# Patient Record
Sex: Female | Born: 1955 | Race: White | Hispanic: No | Marital: Married | Smoking: Never smoker
Health system: Southern US, Community
[De-identification: ages and names within clinical notes are randomized; demographics above are authoritative.]

---

## 2006-07-25 ENCOUNTER — Ambulatory Visit: Payer: Self-pay | Admitting: Family Medicine

## 2007-11-29 ENCOUNTER — Ambulatory Visit: Payer: Self-pay | Admitting: Family Medicine

## 2007-12-13 ENCOUNTER — Ambulatory Visit: Payer: Self-pay | Admitting: Gastroenterology

## 2010-07-29 ENCOUNTER — Ambulatory Visit: Payer: Self-pay | Admitting: Family Medicine

## 2012-12-05 ENCOUNTER — Ambulatory Visit: Payer: Self-pay | Admitting: Obstetrics and Gynecology

## 2012-12-28 ENCOUNTER — Ambulatory Visit: Payer: Self-pay | Admitting: Internal Medicine

## 2013-01-28 ENCOUNTER — Ambulatory Visit: Payer: Self-pay | Admitting: Internal Medicine

## 2013-01-28 LAB — CBC CANCER CENTER
Basophil %: 0.5 %
Eosinophil #: 0.1 x10 3/mm (ref 0.0–0.7)
HCT: 39.5 % (ref 35.0–47.0)
MCHC: 35 g/dL (ref 32.0–36.0)
MCV: 89 fL (ref 80–100)
Monocyte #: 0.5 x10 3/mm (ref 0.2–0.9)
Monocyte %: 6.1 %
Neutrophil #: 5 x10 3/mm (ref 1.4–6.5)
RBC: 4.45 10*6/uL (ref 3.80–5.20)

## 2013-02-15 ENCOUNTER — Ambulatory Visit: Payer: Self-pay | Admitting: Internal Medicine

## 2014-10-09 ENCOUNTER — Ambulatory Visit: Payer: Self-pay | Admitting: Obstetrics and Gynecology

## 2015-07-12 ENCOUNTER — Emergency Department: Payer: BLUE CROSS/BLUE SHIELD

## 2015-07-12 ENCOUNTER — Encounter: Payer: Self-pay | Admitting: Emergency Medicine

## 2015-07-12 ENCOUNTER — Emergency Department
Admission: EM | Admit: 2015-07-12 | Discharge: 2015-07-13 | Disposition: A | Discharge status: Home / Self Care | Payer: BLUE CROSS/BLUE SHIELD | Attending: Emergency Medicine | Admitting: Emergency Medicine

## 2015-07-12 DIAGNOSIS — Y9389 Activity, other specified: Secondary | ICD-10-CM | POA: Insufficient documentation

## 2015-07-12 DIAGNOSIS — S52611A Displaced fracture of right ulna styloid process, initial encounter for closed fracture: Secondary | ICD-10-CM | POA: Insufficient documentation

## 2015-07-12 DIAGNOSIS — S52571A Other intraarticular fracture of lower end of right radius, initial encounter for closed fracture: Secondary | ICD-10-CM | POA: Insufficient documentation

## 2015-07-12 DIAGNOSIS — Y9289 Other specified places as the place of occurrence of the external cause: Secondary | ICD-10-CM | POA: Insufficient documentation

## 2015-07-12 DIAGNOSIS — Y998 Other external cause status: Secondary | ICD-10-CM | POA: Diagnosis not present

## 2015-07-12 DIAGNOSIS — S52501A Unspecified fracture of the lower end of right radius, initial encounter for closed fracture: Secondary | ICD-10-CM

## 2015-07-12 DIAGNOSIS — W1789XA Other fall from one level to another, initial encounter: Secondary | ICD-10-CM | POA: Diagnosis not present

## 2015-07-12 DIAGNOSIS — S6991XA Unspecified injury of right wrist, hand and finger(s), initial encounter: Secondary | ICD-10-CM | POA: Diagnosis present

## 2015-07-12 NOTE — ED Notes (Signed)
Pt presents to ED with c/o right wrist pain. Pt states she was trying to get off her grandsons Advertising copywriterhoover board and she lost her footing and fell back onto her bottom and right wrist. Pt has swelling with possible deformity. Ice applied in triage.

## 2015-07-13 MED ORDER — ONDANSETRON 4 MG PO TBDP
4.0000 mg | ORAL_TABLET | Freq: Once | ORAL | Status: AC
  Administered 2015-07-13: 4 mg via ORAL
  Filled 2015-07-13: qty 1

## 2015-07-13 MED ORDER — OXYCODONE-ACETAMINOPHEN 5-325 MG PO TABS: 1.0000 | ORAL_TABLET | Freq: Four times a day (QID) | ORAL | Status: DC | PRN

## 2015-07-13 MED ORDER — MORPHINE SULFATE (PF) 4 MG/ML IV SOLN
4.0000 mg | Freq: Once | INTRAVENOUS | Status: AC
  Administered 2015-07-13: 4 mg via INTRAMUSCULAR
  Filled 2015-07-13: qty 1

## 2015-07-13 MED ORDER — OXYCODONE-ACETAMINOPHEN 5-325 MG PO TABS
2.0000 | ORAL_TABLET | Freq: Once | ORAL | Status: AC
  Administered 2015-07-13: 2 via ORAL
  Filled 2015-07-13: qty 2

## 2015-07-13 NOTE — ED Provider Notes (Signed)
[facility] Emergency Department Provider Note  Time seen: 12:03 AM  I have reviewed the triage vital signs and the nursing notes.   HISTORY  Chief Complaint Wrist Pain    HPI Dorothy Ware is a 59 y.o. female with no past medical history who presents the emergency department with right wrist pain. According to the patient she was on a hover board and fell off catching herself on the ground. States immediate right wrist pain and swelling. Denies hitting her head or any other injury. Describes her pain as moderate, severe she attempts to move the wrist.     History reviewed. No pertinent past medical history.  There are no active problems to display for this patient.   Past Surgical History  Procedure Laterality Date  . Cesarean section      No current outpatient prescriptions on file.  Allergies Review of patient's allergies indicates no known allergies.  No family history on file.  Social History Social History  Substance Use Topics  . Smoking status: Never Smoker   . Smokeless tobacco: None  . Alcohol Use: Yes    Review of Systems Constitutional: Negative for fever. Cardiovascular: Negative for chest pain. Respiratory: Negative for shortness of breath. Gastrointestinal: Negative for abdominal pain Musculoskeletal: Right wrist pain and swelling Neurological: Negative for headache 10-point ROS otherwise negative.  ____________________________________________   PHYSICAL EXAM:  VITAL SIGNS: ED Triage Vitals  Enc Vitals Group     BP 07/12/15 2038 144/81 mmHg     Pulse Rate 07/12/15 2038 80     Resp 07/12/15 2038 18     Temp 07/12/15 2038 98.4 F (36.9 C)     Temp Source 07/12/15 2038 Oral     SpO2 07/12/15 2038 100 %     Weight 07/12/15 2038 180 lb (81.647 kg)     Height 07/12/15 2038  (1.575 m)     Head Cir --      Peak Flow --      Pain Score 07/12/15 2038 4     Pain Loc --      Pain Edu? --      Excl. in GC? --      Constitutional: Alert and oriented. Well appearing and in no distress. Eyes: Normal exam ENT   Head: Normocephalic and atraumatic.   Mouth/Throat: Mucous membranes are moist. Cardiovascular: Normal rate, regular rhythm. No murmur Respiratory: Normal respiratory effort without tachypnea nor retractions. Breath sounds are clear  Gastrointestinal: Soft and nontender. No distention.  Musculoskeletal: Moderate right wrist swelling, with moderate tenderness to palpation. Good cap refill in all fingers, sensation intact distally. No laceration or break in skin. Neurologic:  Normal speech and language. No gross focal neurologic deficits Skin:  Skin is warm, dry and intact.  Psychiatric: Mood and affect are normal. Speech and behavior are normal.  ____________________________________________   RADIOLOGY  X-ray shows intra-articular fracture of the distal radius with mild ulnar styloid fracture  ____________________________________________   INITIAL IMPRESSION / ASSESSMENT AND PLAN / ED COURSE  Pertinent labs & imaging results that were available during my care of the patient were reviewed by me and considered in my medical decision making (see chart for details).  Patient presents with right wrist pain after falling off of a toy. X-ray shows comminuted minimally displaced fracture of the distal radius with extension into the articular surface, also with mild ulnar styloid fracture. We'll discuss with orthopedics, likely treat pain and splint in place.  Unable to reach  the orthopedist after 2 attempts. We will splint in place, discharge home with orthopedic follow-up tomorrow. Patient agreeable to plan. Patient remains neurovascular intact status post splint.  ____________________________________________   FINAL CLINICAL IMPRESSION(S) / ED DIAGNOSES  Right distal radius fracture Right distal ulna fracture   Minna AntisKevin Heloise Gordan, MD 07/13/15 [phone]

## 2015-07-13 NOTE — Discharge Instructions (Signed)
Wrist Fracture °A wrist fracture is a break or crack in one of the bones of your wrist. Your wrist is made up of eight small bones at the palm of your hand (carpal bones) and two long bones that make up your forearm (radius and ulna). °CAUSES °· A direct blow to the wrist. °· Falling on an outstretched hand. °· Trauma, such as a car accident or a fall. °RISK FACTORS °Risk factors for wrist fracture include: °· Participating in contact and high-risk sports, such as skiing, biking, and ice skating. °· Taking steroid medicines. °· Smoking. °· Being female. °· Being Caucasian. °· Drinking more than three alcoholic beverages per day. °· Having low or lowered bone density (osteoporosis or osteopenia). °· Age. Older adults have decreased bone density. °· Women who have had menopause. °· History of previous fractures. °SIGNS AND SYMPTOMS °Symptoms of wrist fractures include tenderness, bruising, and inflammation. Additionally, the wrist may hang in an odd position or appear deformed. °DIAGNOSIS °Diagnosis may include: °· Physical exam. °· X-ray. °TREATMENT °Treatment depends on many factors, including the nature and location of the fracture, your age, and your activity level. Treatment for wrist fracture can be nonsurgical or surgical. °Nonsurgical Treatment °A plaster cast or splint may be applied to your wrist if the bone is in a good position. If the fracture is not in good position, it may be necessary for your health care provider to realign it before applying a splint or cast. Usually, a cast or splint will be worn for several weeks. °Surgical Treatment °Sometimes the position of the bone is so far out of place that surgery is required to apply a device to hold it together as it heals. Depending on the fracture, there are a number of options for holding the bone in place while it heals, such as a cast and metal pins. °HOME CARE INSTRUCTIONS °· Keep your injured wrist elevated and move your fingers as much as  possible. °· Do not put pressure on any part of your cast or splint. It may break. °· Use a plastic bag to protect your cast or splint from water while bathing or showering. Do not lower your cast or splint into water. °· Take medicines only as directed by your health care provider. °· Keep your cast or splint clean and dry. If it becomes wet, damaged, or suddenly feels too tight, contact your health care provider right away. °· Do not use any tobacco products including cigarettes, chewing tobacco, or electronic cigarettes. Tobacco can delay bone healing. If you need help quitting, ask your health care provider. °· Keep all follow-up visits as directed by your health care provider. This is important. °· Ask your health care provider if you should take supplements of calcium and vitamins C and D to promote bone healing. °SEEK MEDICAL CARE IF: °· Your cast or splint is damaged, breaks, or gets wet. °· You have a fever. °· You have chills. °· You have continued severe pain or more swelling than you did before the cast was put on. °SEEK IMMEDIATE MEDICAL CARE IF: °· Your hand or fingernails on the injured arm turn blue or gray, or feel cold or numb. °· You have decreased feeling in the fingers of your injured arm. °MAKE SURE YOU: °· Understand these instructions. °· Will watch your condition. °· Will get help right away if you are not doing well or get worse. °  °This information is not intended to replace advice given to you by your   health care provider. Make sure you discuss any questions you have with your health care provider. °  °Document Released: 04/13/2005 Document Revised: 03/25/2015 Document Reviewed: 07/22/2011 °Elsevier Interactive Patient Education ©2016 [employer] Inc. ° °

## 2015-07-13 NOTE — ED Notes (Signed)
Pt dc home ambulatory pain unchanged instructed on follow up plan PT NAD AT DC 

## 2015-10-07 ENCOUNTER — Other Ambulatory Visit: Payer: Self-pay | Admitting: Obstetrics and Gynecology

## 2015-10-07 DIAGNOSIS — Z1231 Encounter for screening mammogram for malignant neoplasm of breast: Secondary | ICD-10-CM

## 2015-10-12 ENCOUNTER — Ambulatory Visit: Payer: BLUE CROSS/BLUE SHIELD | Attending: Obstetrics and Gynecology

## 2015-10-23 ENCOUNTER — Ambulatory Visit
Admission: RE | Admit: 2015-10-23 | Discharge: 2015-10-23 | Disposition: A | Discharge status: Home / Self Care | Payer: BLUE CROSS/BLUE SHIELD | Source: Ambulatory Visit | Attending: Obstetrics and Gynecology | Admitting: Obstetrics and Gynecology

## 2015-10-23 DIAGNOSIS — Z1231 Encounter for screening mammogram for malignant neoplasm of breast: Secondary | ICD-10-CM

## 2015-11-20 ENCOUNTER — Telehealth: Payer: Self-pay | Admitting: *Deleted

## 2015-11-20 NOTE — Telephone Encounter (Signed)
Received fax from Dr. Francesca OmanSchermerhorn's office with message to Dr. Neale BurlyGitten regarding his seeing patient in 2014 for decreased WBC.  She was asking if there are any new recommendations.  Per Dr. Donneta RombergBrahmanday, if the patient is not having frequent infections there are no new recommendations at this time.  [facility] spoke with Shanda BumpsJessica who will relay the message to MD on Monday, May 8.

## 2016-12-08 ENCOUNTER — Other Ambulatory Visit: Payer: Self-pay | Admitting: Obstetrics and Gynecology

## 2016-12-08 DIAGNOSIS — Z1231 Encounter for screening mammogram for malignant neoplasm of breast: Secondary | ICD-10-CM

## 2016-12-27 ENCOUNTER — Ambulatory Visit
Admission: RE | Admit: 2016-12-27 | Discharge: 2016-12-27 | Disposition: A | Discharge status: Home / Self Care | Payer: BLUE CROSS/BLUE SHIELD | Source: Ambulatory Visit | Attending: Obstetrics and Gynecology | Admitting: Obstetrics and Gynecology

## 2016-12-27 DIAGNOSIS — Z1231 Encounter for screening mammogram for malignant neoplasm of breast: Secondary | ICD-10-CM

## 2018-09-17 ENCOUNTER — Other Ambulatory Visit: Payer: Self-pay | Admitting: Obstetrics and Gynecology

## 2018-09-17 DIAGNOSIS — Z1231 Encounter for screening mammogram for malignant neoplasm of breast: Secondary | ICD-10-CM

## 2018-09-28 ENCOUNTER — Other Ambulatory Visit: Payer: Self-pay

## 2018-09-28 ENCOUNTER — Ambulatory Visit
Admission: RE | Admit: 2018-09-28 | Discharge: 2018-09-28 | Disposition: A | Discharge status: Home / Self Care | Payer: BLUE CROSS/BLUE SHIELD | Source: Ambulatory Visit | Attending: Obstetrics and Gynecology | Admitting: Obstetrics and Gynecology

## 2018-09-28 DIAGNOSIS — Z1231 Encounter for screening mammogram for malignant neoplasm of breast: Secondary | ICD-10-CM | POA: Insufficient documentation

## 2018-10-01 ENCOUNTER — Other Ambulatory Visit: Payer: Self-pay | Admitting: Obstetrics and Gynecology

## 2018-10-01 DIAGNOSIS — N6310 Unspecified lump in the right breast, unspecified quadrant: Secondary | ICD-10-CM

## 2018-11-05 ENCOUNTER — Other Ambulatory Visit: Payer: Self-pay

## 2018-11-05 ENCOUNTER — Ambulatory Visit
Admission: RE | Admit: 2018-11-05 | Discharge: 2018-11-05 | Disposition: A | Discharge status: Home / Self Care | Payer: BLUE CROSS/BLUE SHIELD | Source: Ambulatory Visit | Attending: Obstetrics and Gynecology | Admitting: Obstetrics and Gynecology

## 2018-11-05 DIAGNOSIS — N6310 Unspecified lump in the right breast, unspecified quadrant: Secondary | ICD-10-CM

## 2019-09-27 ENCOUNTER — Other Ambulatory Visit: Payer: Self-pay

## 2019-09-27 ENCOUNTER — Ambulatory Visit: Payer: BC Managed Care – PPO | Attending: Internal Medicine

## 2019-09-27 DIAGNOSIS — Z23 Encounter for immunization: Secondary | ICD-10-CM

## 2019-09-27 NOTE — Progress Notes (Signed)
   Covid-19 Vaccination Clinic  Name:  Dorothy Ware    MRN: [id number] DOB: [birth year]  09/27/2019  [name] was observed post Covid-19 immunization for 15 minutes without incident. She was provided with Vaccine Information Sheet and instruction to access the V-Safe system.   [name] was instructed to call 911 with any severe reactions post vaccine: Marland Kitchen Difficulty breathing  . Swelling of face and throat  . A fast heartbeat  . A bad rash all over body  . Dizziness and weakness   Immunizations Administered    Name Date Dose VIS Date Route   Pfizer COVID-19 Vaccine 09/27/2019 10:23 AM 0.3 mL 06/28/2019 Intramuscular   Manufacturer: [employer], [employer]   Lot: [id number]   NDC: [address]-4

## 2019-10-16 ENCOUNTER — Other Ambulatory Visit: Payer: Self-pay | Admitting: Obstetrics and Gynecology

## 2019-10-16 DIAGNOSIS — Z1231 Encounter for screening mammogram for malignant neoplasm of breast: Secondary | ICD-10-CM

## 2019-10-22 ENCOUNTER — Other Ambulatory Visit: Payer: Self-pay

## 2019-10-22 ENCOUNTER — Ambulatory Visit: Payer: BC Managed Care – PPO | Attending: Internal Medicine

## 2019-10-22 DIAGNOSIS — Z23 Encounter for immunization: Secondary | ICD-10-CM

## 2019-10-22 NOTE — Progress Notes (Signed)
   Covid-19 Vaccination Clinic  Name:  Dorothy Ware    MRN: [id number] DOB: 31-Aug-1955  10/22/2019  [name] was observed post Covid-19 immunization for 15 minutes without incident. She was provided with Vaccine Information Sheet and instruction to access the V-Safe system.   [name] was instructed to call 911 with any severe reactions post vaccine: Marland Kitchen Difficulty breathing  . Swelling of face and throat  . A fast heartbeat  . A bad rash all over body  . Dizziness and weakness   Immunizations Administered    Name Date Dose VIS Date Route   Pfizer COVID-19 Vaccine 10/22/2019  4:04 PM 0.3 mL 06/28/2019 Intramuscular   Manufacturer: [employer], [employer]   Lot: [id number]   NDC: [address]-4

## 2019-12-09 ENCOUNTER — Ambulatory Visit
Admission: RE | Admit: 2019-12-09 | Discharge: 2019-12-09 | Disposition: A | Discharge status: Home / Self Care | Payer: BC Managed Care – PPO | Source: Ambulatory Visit | Attending: Obstetrics and Gynecology | Admitting: Obstetrics and Gynecology

## 2019-12-09 DIAGNOSIS — Z1231 Encounter for screening mammogram for malignant neoplasm of breast: Secondary | ICD-10-CM | POA: Insufficient documentation

## 2021-01-20 ENCOUNTER — Other Ambulatory Visit: Payer: Self-pay | Admitting: Family

## 2021-01-20 ENCOUNTER — Other Ambulatory Visit: Payer: Self-pay | Admitting: Obstetrics and Gynecology

## 2021-01-20 DIAGNOSIS — Z1231 Encounter for screening mammogram for malignant neoplasm of breast: Secondary | ICD-10-CM

## 2021-01-27 ENCOUNTER — Ambulatory Visit
Admission: RE | Admit: 2021-01-27 | Discharge: 2021-01-27 | Disposition: A | Discharge status: Home / Self Care | Payer: Medicare HMO | Source: Ambulatory Visit | Attending: Family | Admitting: Family

## 2021-01-27 ENCOUNTER — Other Ambulatory Visit: Payer: Self-pay

## 2021-01-27 DIAGNOSIS — Z1231 Encounter for screening mammogram for malignant neoplasm of breast: Secondary | ICD-10-CM | POA: Insufficient documentation

## 2021-08-18 IMAGING — MG MM DIGITAL SCREENING BILAT W/ TOMO AND CAD
6 of 10 series · 6 of 30 positions shown · non-contrast
Comparison: Previous exam(s).

CLINICAL DATA: Screening.

EXAM:
DIGITAL SCREENING BILATERAL MAMMOGRAM WITH TOMOSYNTHESIS AND CAD
TECHNIQUE: Bilateral screening digital craniocaudal and mediolateral oblique
mammograms were obtained. Bilateral screening digital breast
tomosynthesis was performed. The images were evaluated with
computer-aided detection.

[L CC synth-2D]
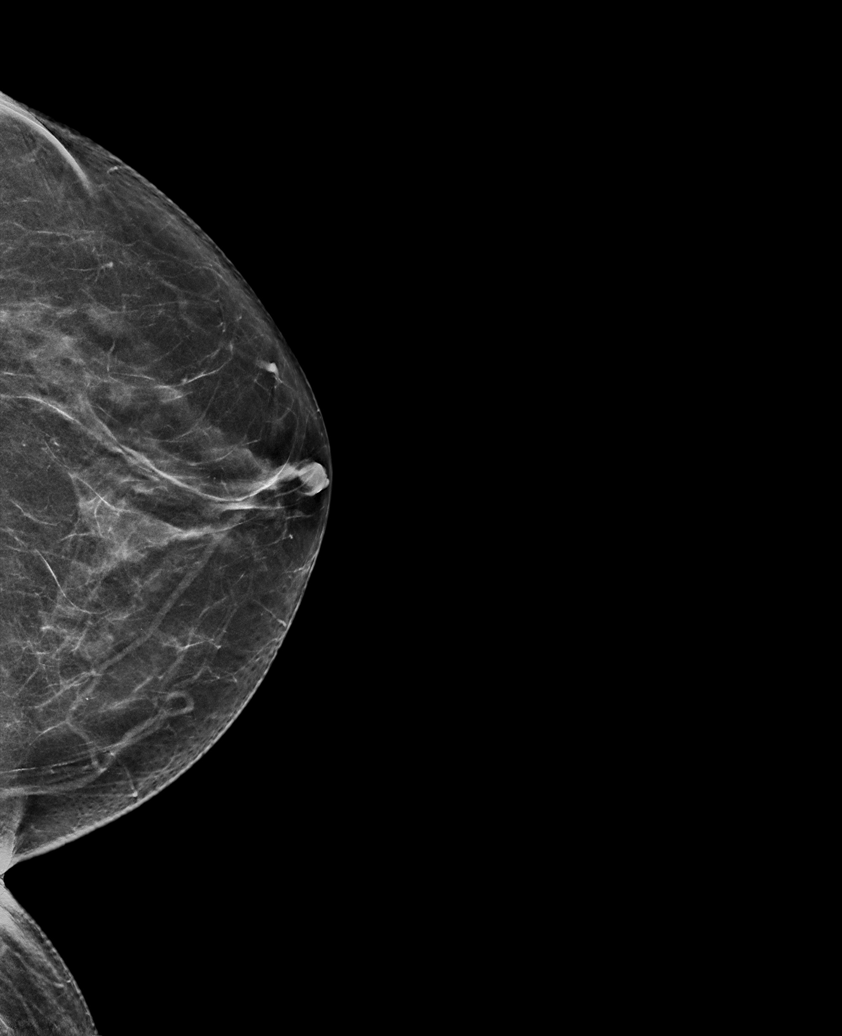

[L MLO synth-2D]
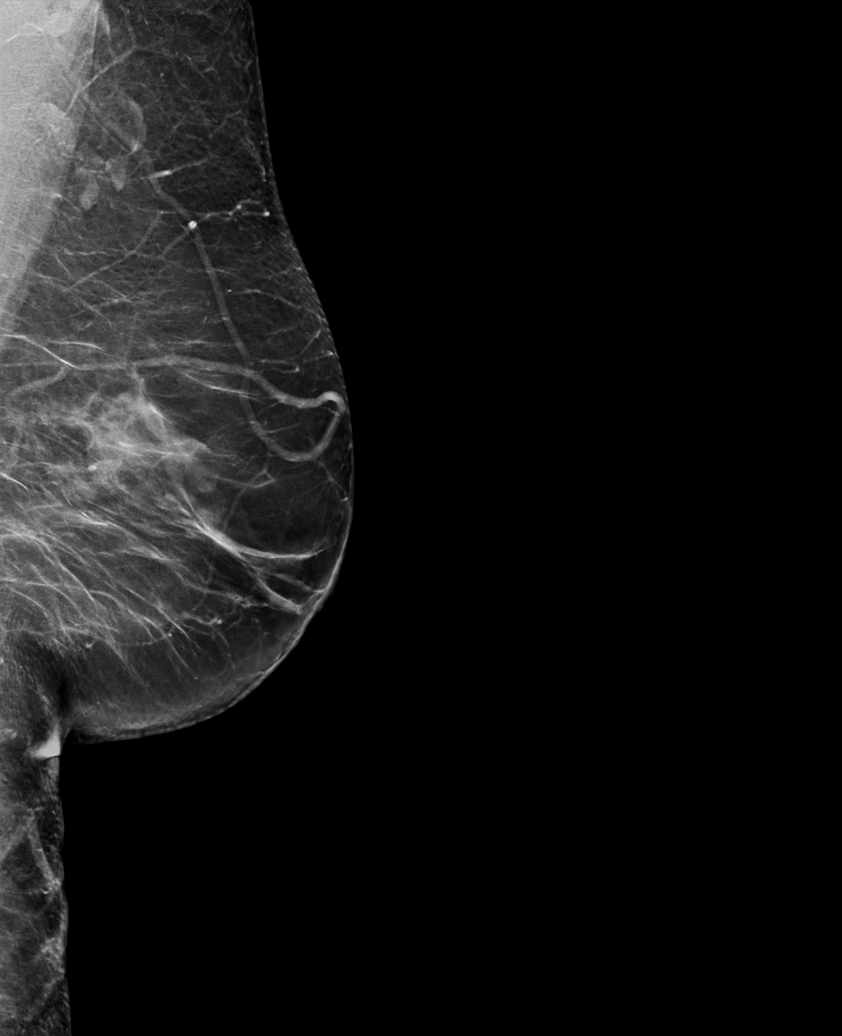

[R CC synth-2D]
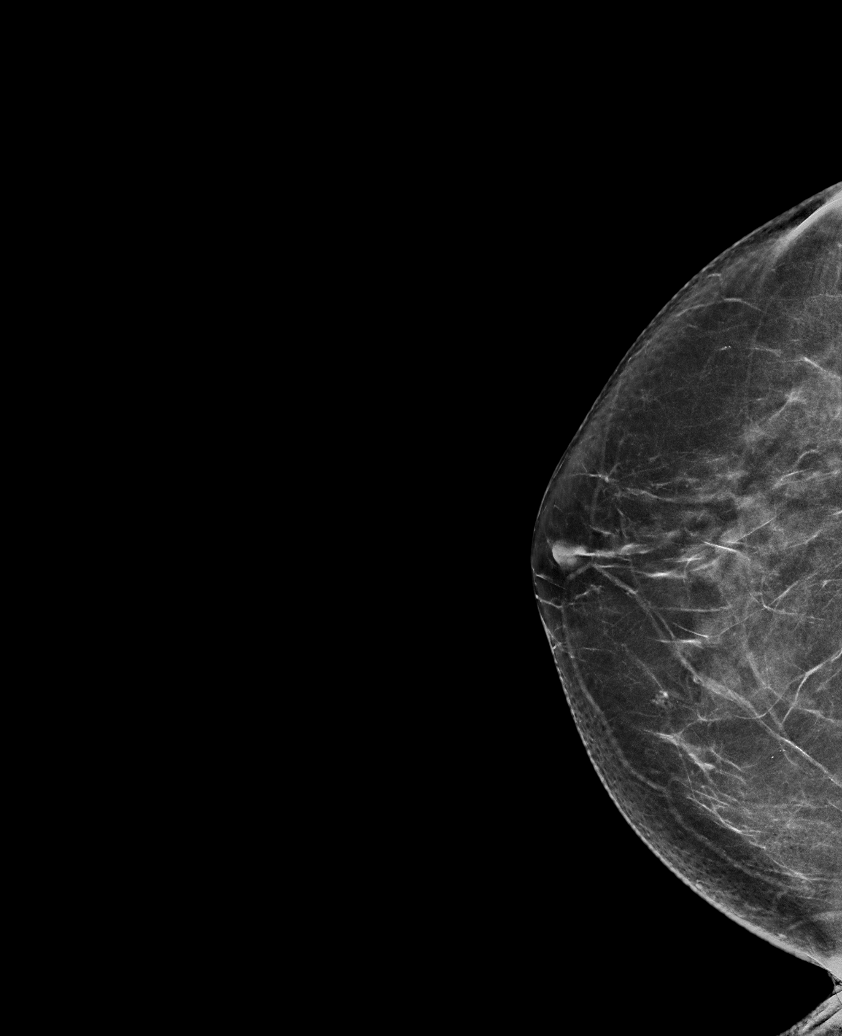

[R MLO synth-2D (1 of 2)]
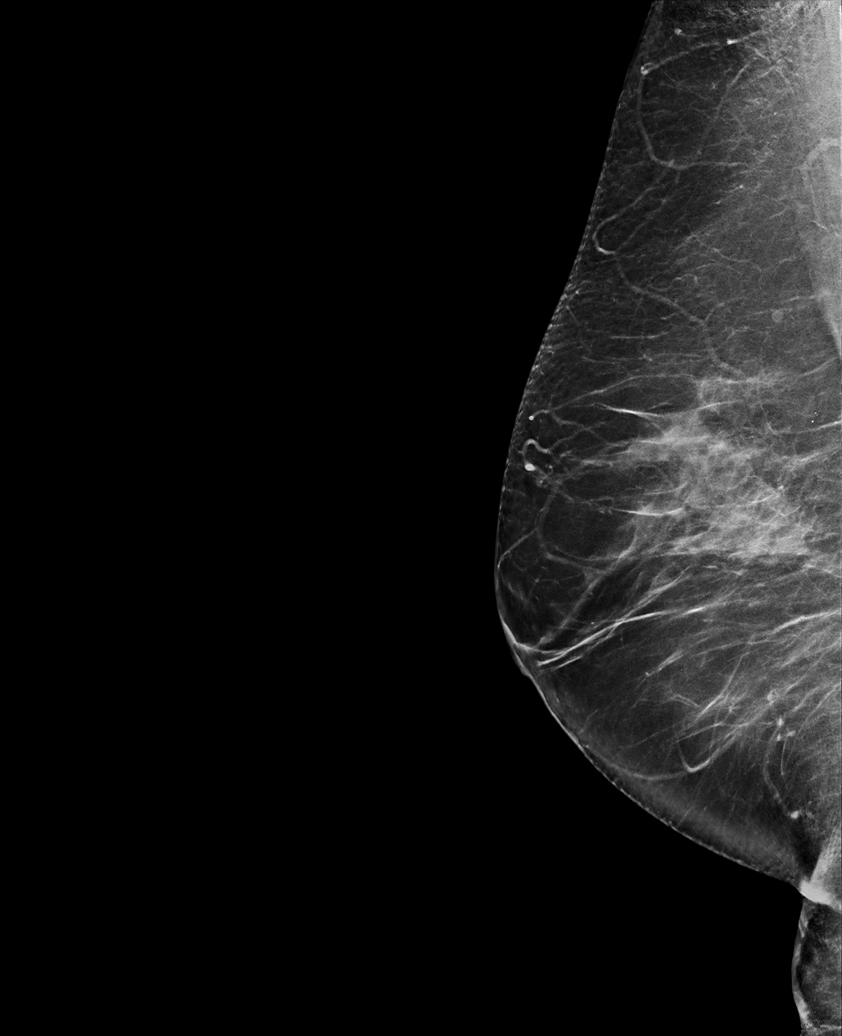

[R MLO synth-2D (2 of 2)]
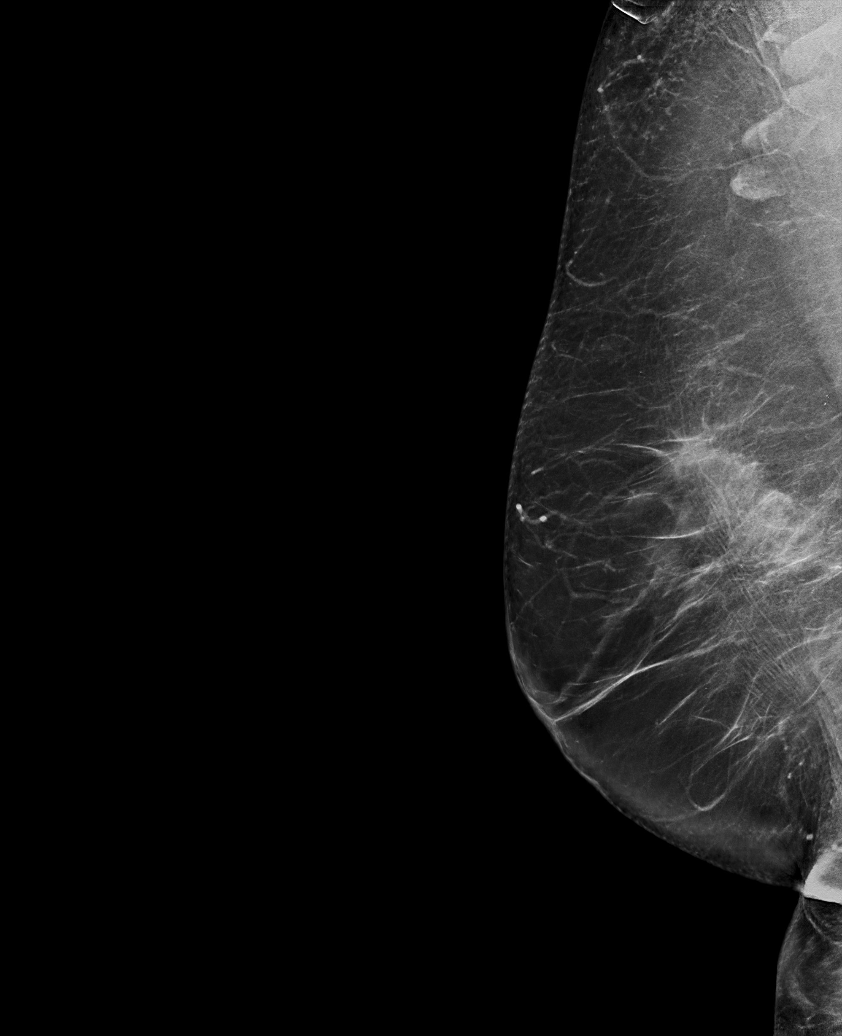

[L MLO tomo · tomo slice 36/71.0]
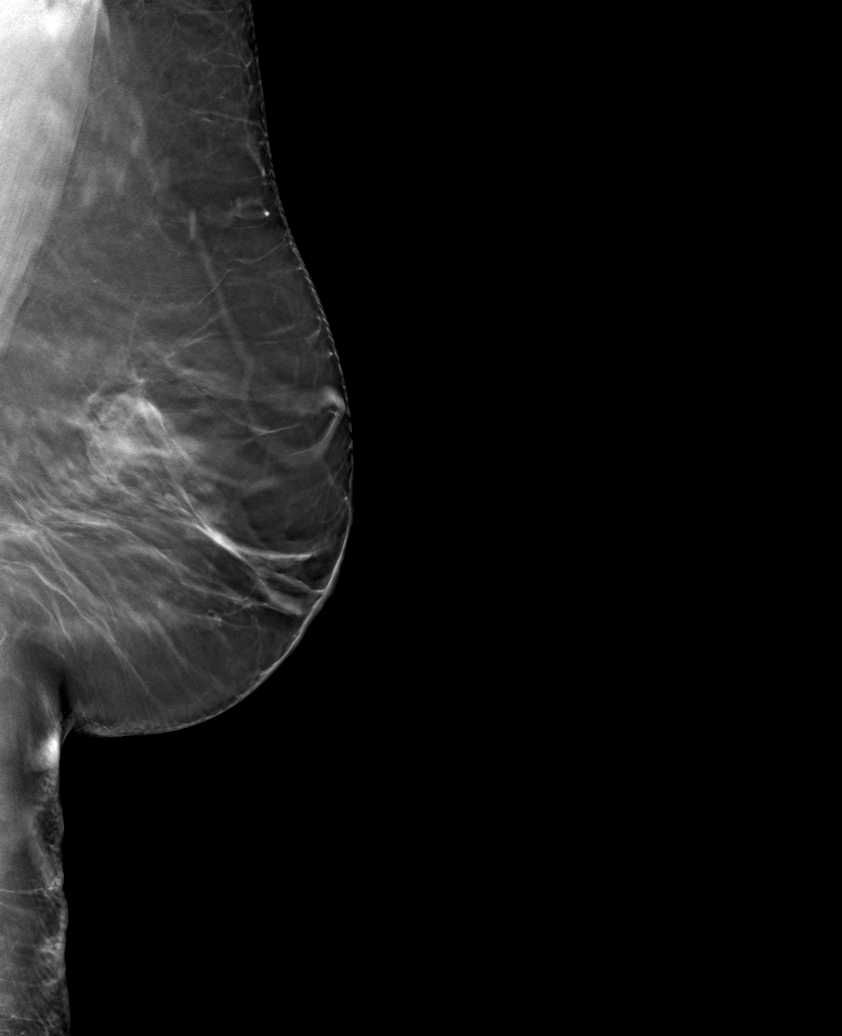

[6 of 30 positions shown; findings below may reference images not displayed]

ACR Breast Density Category b: There are scattered areas of
fibroglandular density.
FINDINGS: There are no findings suspicious for malignancy.
IMPRESSION: No mammographic evidence of malignancy. A result letter of this
screening mammogram will be mailed directly to the patient.

RECOMMENDATION:
Screening mammogram in one year. (Code:51-O-LD2)

BI-RADS CATEGORY  1: Negative.

## 2022-04-21 ENCOUNTER — Other Ambulatory Visit: Payer: Self-pay | Admitting: Family

## 2022-04-21 DIAGNOSIS — Z1231 Encounter for screening mammogram for malignant neoplasm of breast: Secondary | ICD-10-CM

## 2022-10-20 ENCOUNTER — Ambulatory Visit
Admission: RE | Admit: 2022-10-20 | Discharge: 2022-10-20 | Disposition: A | Discharge status: Home / Self Care | Payer: Medicare HMO | Source: Ambulatory Visit | Attending: Family | Admitting: Family

## 2022-10-20 DIAGNOSIS — Z1231 Encounter for screening mammogram for malignant neoplasm of breast: Secondary | ICD-10-CM | POA: Insufficient documentation

## 2023-10-13 ENCOUNTER — Other Ambulatory Visit: Payer: Self-pay | Admitting: Orthopedic Surgery

## 2023-10-13 DIAGNOSIS — S46001A Unspecified injury of muscle(s) and tendon(s) of the rotator cuff of right shoulder, initial encounter: Secondary | ICD-10-CM

## 2023-10-13 DIAGNOSIS — M25511 Pain in right shoulder: Secondary | ICD-10-CM

## 2023-10-13 DIAGNOSIS — M25311 Other instability, right shoulder: Secondary | ICD-10-CM

## 2023-10-17 ENCOUNTER — Ambulatory Visit
Admission: RE | Admit: 2023-10-17 | Discharge: 2023-10-17 | Disposition: A | Discharge status: Home / Self Care | Source: Ambulatory Visit | Attending: Orthopedic Surgery | Admitting: Orthopedic Surgery

## 2023-10-17 DIAGNOSIS — M25511 Pain in right shoulder: Secondary | ICD-10-CM | POA: Diagnosis present

## 2023-10-17 DIAGNOSIS — S46001A Unspecified injury of muscle(s) and tendon(s) of the rotator cuff of right shoulder, initial encounter: Secondary | ICD-10-CM | POA: Diagnosis present

## 2023-10-17 DIAGNOSIS — M25311 Other instability, right shoulder: Secondary | ICD-10-CM | POA: Insufficient documentation

## 2023-12-21 ENCOUNTER — Encounter: Payer: Self-pay | Admitting: Family

## 2023-12-21 ENCOUNTER — Other Ambulatory Visit: Payer: Self-pay | Admitting: Family

## 2023-12-21 DIAGNOSIS — Z1231 Encounter for screening mammogram for malignant neoplasm of breast: Secondary | ICD-10-CM

## 2023-12-22 ENCOUNTER — Other Ambulatory Visit: Payer: Self-pay | Admitting: Orthopedic Surgery

## 2023-12-25 ENCOUNTER — Encounter: Payer: Self-pay | Admitting: Orthopedic Surgery

## 2023-12-27 ENCOUNTER — Ambulatory Visit
Admission: RE | Admit: 2023-12-27 | Discharge: 2023-12-27 | Disposition: A | Discharge status: Home / Self Care | Source: Ambulatory Visit | Attending: Family | Admitting: Family

## 2023-12-27 DIAGNOSIS — Z1231 Encounter for screening mammogram for malignant neoplasm of breast: Secondary | ICD-10-CM | POA: Insufficient documentation

## 2024-01-04 ENCOUNTER — Encounter
Admission: RE | Disposition: A | Discharge status: Home / Self Care | Payer: Self-pay | Source: Home / Self Care | Attending: Orthopedic Surgery

## 2024-01-04 ENCOUNTER — Ambulatory Visit
Admission: RE | Admit: 2024-01-04 | Discharge: 2024-01-04 | Disposition: A | Discharge status: Home / Self Care | Attending: Orthopedic Surgery | Admitting: Orthopedic Surgery

## 2024-01-04 ENCOUNTER — Ambulatory Visit: Payer: Self-pay | Admitting: Anesthesiology

## 2024-01-04 ENCOUNTER — Other Ambulatory Visit: Payer: Self-pay

## 2024-01-04 DIAGNOSIS — I4510 Unspecified right bundle-branch block: Secondary | ICD-10-CM | POA: Diagnosis not present

## 2024-01-04 DIAGNOSIS — W010XXA Fall on same level from slipping, tripping and stumbling without subsequent striking against object, initial encounter: Secondary | ICD-10-CM | POA: Diagnosis not present

## 2024-01-04 DIAGNOSIS — M19011 Primary osteoarthritis, right shoulder: Secondary | ICD-10-CM | POA: Insufficient documentation

## 2024-01-04 DIAGNOSIS — I10 Essential (primary) hypertension: Secondary | ICD-10-CM | POA: Diagnosis not present

## 2024-01-04 DIAGNOSIS — M25811 Other specified joint disorders, right shoulder: Secondary | ICD-10-CM | POA: Diagnosis not present

## 2024-01-04 DIAGNOSIS — S46001A Unspecified injury of muscle(s) and tendon(s) of the rotator cuff of right shoulder, initial encounter: Secondary | ICD-10-CM | POA: Insufficient documentation

## 2024-01-04 DIAGNOSIS — M65911 Unspecified synovitis and tenosynovitis, right shoulder: Secondary | ICD-10-CM | POA: Insufficient documentation

## 2024-01-04 HISTORY — PX: POSTERIOR LUMBAR FUSION 2 WITH HARDWARE REMOVAL: SHX7297

## 2024-01-04 HISTORY — PX: SUBACROMIAL DECOMPRESSION: SHX5174

## 2024-01-04 HISTORY — PX: BICEPT TENODESIS: SHX5116

## 2024-01-04 HISTORY — PX: SHOULDER ARTHROSCOPY WITH OPEN ROTATOR CUFF REPAIR: SHX6092

## 2024-01-04 SURGERY — ARTHROSCOPY, SHOULDER WITH REPAIR, ROTATOR CUFF, OPEN
Anesthesia: General | Site: Shoulder | Laterality: Right

## 2024-01-04 MED ORDER — BUPIVACAINE LIPOSOME 1.3 % IJ SUSP
INTRAMUSCULAR | Status: AC
  Filled 2024-01-04: qty 20

## 2024-01-04 MED ORDER — ONDANSETRON HCL 4 MG/2ML IJ SOLN
INTRAMUSCULAR | Status: DC | PRN
  Administered 2024-01-04: 4 mg via INTRAVENOUS

## 2024-01-04 MED ORDER — LACTATED RINGERS IR SOLN
Status: DC | PRN
Start: 2024-01-04 — End: 2024-01-04
  Administered 2024-01-04: 6000 mL
  Administered 2024-01-04: 12000 mL

## 2024-01-04 MED ORDER — LACTATED RINGERS IV SOLN: INTRAVENOUS | Status: DC

## 2024-01-04 MED ORDER — FENTANYL CITRATE (PF) 100 MCG/2ML IJ SOLN
100.0000 ug | Freq: Once | INTRAMUSCULAR | Status: AC
  Administered 2024-01-04: 50 ug via INTRAVENOUS

## 2024-01-04 MED ORDER — ONDANSETRON 4 MG PO TBDP: 4.0000 mg | ORAL_TABLET | Freq: Three times a day (TID) | ORAL | 0 refills | Status: AC | PRN

## 2024-01-04 MED ORDER — PHENYLEPHRINE HCL-NACL 20-0.9 MG/250ML-% IV SOLN
INTRAVENOUS | Status: DC | PRN
Start: 2024-01-04 — End: 2024-01-04
  Administered 2024-01-04: 14 ug/min via INTRAVENOUS

## 2024-01-04 MED ORDER — SEVOFLURANE IN SOLN
RESPIRATORY_TRACT | Status: AC
  Filled 2024-01-04: qty 250

## 2024-01-04 MED ORDER — HYALURONIDASE HUMAN 150 UNIT/ML IJ SOLN
INTRAMUSCULAR | Status: AC
  Filled 2024-01-04: qty 1

## 2024-01-04 MED ORDER — GLYCOPYRROLATE 0.2 MG/ML IJ SOLN
INTRAMUSCULAR | Status: DC | PRN
Start: 2024-01-04 — End: 2024-01-04
  Administered 2024-01-04: .2 mg via INTRAVENOUS

## 2024-01-04 MED ORDER — LACTATED RINGERS IV SOLN
INTRAVENOUS | Status: DC | PRN
  Administered 2024-01-04: 4 mL

## 2024-01-04 MED ORDER — SUCCINYLCHOLINE CHLORIDE 200 MG/10ML IV SOSY
PREFILLED_SYRINGE | INTRAVENOUS | Status: DC | PRN
  Administered 2024-01-04: 160 mg via INTRAVENOUS

## 2024-01-04 MED ORDER — DEXAMETHASONE SODIUM PHOSPHATE 4 MG/ML IJ SOLN
INTRAMUSCULAR | Status: DC | PRN
  Administered 2024-01-04: 4 mg via INTRAVENOUS

## 2024-01-04 MED ORDER — BUPIVACAINE HCL (PF) 0.5 % IJ SOLN
INTRAMUSCULAR | Status: DC | PRN
  Administered 2024-01-04: 10 mL via PERINEURAL

## 2024-01-04 MED ORDER — ACETAMINOPHEN 500 MG PO TABS: 1000.0000 mg | ORAL_TABLET | Freq: Three times a day (TID) | ORAL | 2 refills | Status: AC

## 2024-01-04 MED ORDER — ASPIRIN 325 MG PO TBEC: 325.0000 mg | DELAYED_RELEASE_TABLET | Freq: Every day | ORAL | 0 refills | Status: AC

## 2024-01-04 MED ORDER — BUPIVACAINE HCL (PF) 0.5 % IJ SOLN
INTRAMUSCULAR | Status: AC
  Filled 2024-01-04: qty 10

## 2024-01-04 MED ORDER — LIDOCAINE HCL (CARDIAC) PF 100 MG/5ML IV SOSY
PREFILLED_SYRINGE | INTRAVENOUS | Status: DC | PRN
  Administered 2024-01-04: 50 mg via INTRAVENOUS

## 2024-01-04 MED ORDER — BUPIVACAINE LIPOSOME 1.3 % IJ SUSP
INTRAMUSCULAR | Status: DC | PRN
  Administered 2024-01-04: 20 mL via PERINEURAL

## 2024-01-04 MED ORDER — SEVOFLURANE IN SOLN
RESPIRATORY_TRACT | Status: AC
Start: 2024-01-04 — End: 2024-01-04
  Filled 2024-01-04: qty 250

## 2024-01-04 MED ORDER — OXYCODONE HCL 5 MG PO TABS: 5.0000 mg | ORAL_TABLET | ORAL | 0 refills | Status: AC | PRN

## 2024-01-04 MED ORDER — FENTANYL CITRATE (PF) 100 MCG/2ML IJ SOLN
INTRAMUSCULAR | Status: AC
  Filled 2024-01-04: qty 2

## 2024-01-04 MED ORDER — CEFAZOLIN SODIUM-DEXTROSE 2-3 GM-%(50ML) IV SOLR
INTRAVENOUS | Status: AC
  Filled 2024-01-04: qty 50

## 2024-01-04 MED ORDER — EPHEDRINE SULFATE (PRESSORS) 50 MG/ML IJ SOLN
INTRAMUSCULAR | Status: DC | PRN
Start: 2024-01-04 — End: 2024-01-04
  Administered 2024-01-04: 10 mg via INTRAVENOUS

## 2024-01-04 MED ORDER — CEFAZOLIN SODIUM-DEXTROSE 2-4 GM/100ML-% IV SOLN
2.0000 g | INTRAVENOUS | Status: AC
  Administered 2024-01-04: 2 g via INTRAVENOUS

## 2024-01-04 MED ORDER — PROPOFOL 10 MG/ML IV BOLUS
INTRAVENOUS | Status: DC | PRN
  Administered 2024-01-04: 150 mg via INTRAVENOUS

## 2024-01-04 SURGICAL SUPPLY — 45 items
ANCH ALPHAVENT BI 4.75 2S 1.4 (Anchor) IMPLANT
ANCHOR 2.3 SP SGL 1.2 XBRAID (Anchor) IMPLANT
ANCHOR 3.9 PEEK CORKSCREW 5MTS (Anchor) IMPLANT
ANCHOR SUT BIOCOMP LK 2.9X12.5 (Anchor) IMPLANT
ANCHOR SWIVELOCK BIO 4.75X19.1 (Anchor) IMPLANT
BLADE SHAVER 4.5X7 STR FR (MISCELLANEOUS) ×1 IMPLANT
BUR BR 5.5 WIDE MOUTH (BURR) ×1 IMPLANT
CANNULA PART THRD DISP 5.75X7 (CANNULA) ×1 IMPLANT
CANNULA PARTIAL THREAD 2X7 (CANNULA) ×1 IMPLANT
CANNULA TWIST IN 8.25X7CM (CANNULA) IMPLANT
CHLORAPREP W/TINT 26 (MISCELLANEOUS) ×1 IMPLANT
COOLER POLAR GLACIER W/PUMP (MISCELLANEOUS) ×1 IMPLANT
COVER LIGHT HANDLE UNIVERSAL (MISCELLANEOUS) ×3 IMPLANT
CRADLE LAMINECT ARM (MISCELLANEOUS) ×1 IMPLANT
DRAPE U-SHAPE 48X52 POLY STRL (PACKS) ×1 IMPLANT
DRSG TEGADERM 4X4.75 (GAUZE/BANDAGES/DRESSINGS) ×3 IMPLANT
ELECTRODE REM PT RTRN 9FT ADLT (ELECTROSURGICAL) ×1 IMPLANT
GAUZE SPONGE 4X4 12PLY STRL (GAUZE/BANDAGES/DRESSINGS) ×1 IMPLANT
GAUZE XEROFORM 1X8 LF (GAUZE/BANDAGES/DRESSINGS) ×1 IMPLANT
GLOVE BIOGEL PI IND STRL 8 (GLOVE) ×2 IMPLANT
GLOVE SURG SS PI 7.5 STRL IVOR (GLOVE) ×3 IMPLANT
GLOVE SURG SYN 8.0 (GLOVE) ×1 IMPLANT
GLOVE SURG SYN 8.0 PF PI (GLOVE) ×1 IMPLANT
GOWN STRL REIN 2XL XLG LVL4 (GOWN DISPOSABLE) ×1 IMPLANT
GOWN STRL REUS W/ TWL LRG LVL3 (GOWN DISPOSABLE) ×3 IMPLANT
IV LR IRRIG 3000ML ARTHROMATIC (IV SOLUTION) ×4 IMPLANT
KIT CORKSCREW KNTLS 3.9 S/T/P (INSTRUMENTS) IMPLANT
KIT DISPOSABLE PUSHLOCK 2.9MM (KITS) IMPLANT
KIT STABILIZATION SHOULDER (MISCELLANEOUS) ×1 IMPLANT
KIT TURNOVER KIT A (KITS) ×1 IMPLANT
MANIFOLD NEPTUNE II (INSTRUMENTS) ×1 IMPLANT
MASK FACE SPIDER DISP (MASK) ×1 IMPLANT
MAT ABSORB FLUID 56X50 GRAY (MISCELLANEOUS) ×2 IMPLANT
PACK ARTHROSCOPY SHOULDER (MISCELLANEOUS) ×1 IMPLANT
PAD ABD DERMACEA PRESS 5X9 (GAUZE/BANDAGES/DRESSINGS) ×2 IMPLANT
PAD WRAPON POLAR SHDR XLG (MISCELLANEOUS) ×1 IMPLANT
PASSER SUT SWIFTSTITCH HIP CRT (INSTRUMENTS) IMPLANT
SET Y ADAPTER MULIT-BAG IRRIG (MISCELLANEOUS) ×2 IMPLANT
SUT PROLENE 0 CT 2 (SUTURE) ×1 IMPLANT
SUTURE 0 TIGRLNK 1.5 FWIR CLS (SUTURE) IMPLANT
SUTURE EHLN 3-0 FS-10 30 BLK (SUTURE) IMPLANT
TUBE SET DOUBLEFLO INFLOW (TUBING) ×1 IMPLANT
TUBING CONNECTING 10 (TUBING) ×1 IMPLANT
TUBING OUTFLOW SET DBLFO PUMP (TUBING) ×1 IMPLANT
WAND WEREWOLF FLOW 90D (MISCELLANEOUS) ×1 IMPLANT

## 2024-01-04 NOTE — H&P (Signed)
 Paper H&P to be scanned into permanent record. H&P reviewed. No significant changes noted.

## 2024-01-04 NOTE — Discharge Instructions (Addendum)
Post-Op Instructions - Rotator Cuff Repair  1. Bracing: You will wear a shoulder immobilizer or sling for 6 weeks.   2. Driving: No driving for 3 weeks post-op. When driving, do not wear the immobilizer. Ideally, we recommend no driving for 6 weeks while sling is in place as one arm will be immobilized.   3. Activity: No active lifting for 2 months. Wrist, hand, and elbow motion only. Avoid lifting the upper arm away from the body except for hygiene. You are permitted to bend and straighten the elbow passively only (no active elbow motion). You may use your hand and wrist for typing, writing, and managing utensils (cutting food). Do not lift more than a coffee cup for 8 weeks.  When sleeping or resting, inclined positions (recliner chair or wedge pillow) and a pillow under the forearm for support may provide better comfort for up to 4 weeks.  Avoid long distance travel for 4 weeks.  Return to normal activities after rotator cuff repair repair normally takes 6 months on average. If rehab goes very well, may be able to do most activities at 4 months, except overhead or contact sports.  4. Physical Therapy: Begins 3-4 days after surgery, and proceed 1 time per week for the first 6 weeks, then 1-2 times per week from weeks 6-20 post-op.  5. Medications:  - You will be provided a prescription for narcotic pain medicine. After surgery, take 1-2 narcotic tablets every 4 hours if needed for severe pain.  - A prescription for anti-nausea medication will be provided in case the narcotic medicine causes nausea - take 1 tablet every 6 hours only if nauseated.   - Take tylenol 1000 mg (2 Extra Strength tablets or 3 regular strength) every 8 hours for pain.  May decrease or stop tylenol 5 days after surgery if you are having minimal pain. - Take ASA 325mg /day x 2 weeks to help prevent DVTs/PEs (blood clots).  - DO NOT take ANY nonsteroidal anti-inflammatory pain medications (Advil, Motrin, Ibuprofen, Aleve,  Naproxen, or Naprosyn). These medicines can inhibit healing of your shoulder repair.    If you are taking prescription medication for anxiety, depression, insomnia, muscle spasm, chronic pain, or for attention deficit disorder, you are advised that you are at a higher risk of adverse effects with use of narcotics post-op, including narcotic addiction/dependence, depressed breathing, death. If you use non-prescribed substances: alcohol, marijuana, cocaine, heroin, methamphetamines, etc., you are at a higher risk of adverse effects with use of narcotics post-op, including narcotic addiction/dependence, depressed breathing, death. You are advised that taking > 50 morphine milligram equivalents (MME) of narcotic pain medication per day results in twice the risk of overdose or death. For your prescription provided: oxycodone 5 mg - taking more than 6 tablets per day would result in > 50 morphine milligram equivalents (MME) of narcotic pain medication. Be advised that we will prescribe narcotics short-term, for acute post-operative pain only - 3 weeks for major operations such as shoulder repair/reconstruction surgeries.     6. Post-Op Appointment:  Your first post-op appointment will be 10-14 days post-op.  7. Work or School: For most, but not all procedures, we advise staying out of work or school for at least 1 to 2 weeks in order to recover from the stress of surgery and to allow time for healing.   If you need a work or school note this can be provided.   8. Smoking: If you are a smoker, you need to refrain from  smoking in the postoperative period. The nicotine in cigarettes will inhibit healing of your shoulder repair and decrease the chance of successful repair. Similarly, nicotine containing products (gum, patches) should be avoided.   Post-operative Brace: Apply and remove the brace you received as you were instructed to at the time of fitting and as described in detail as the brace's  instructions for use indicate.  Wear the brace for the period of time prescribed by your physician.  The brace can be cleaned with soap and water and allowed to air dry only.  Should the brace result in increased pain, decreased feeling (numbness/tingling), increased swelling or an overall worsening of your medical condition, please contact your doctor immediately.  If an emergency situation occurs as a result of wearing the brace after normal business hours, please dial 911 and seek immediate medical attention.  Let your doctor know if you have any further questions about the brace issued to you. Refer to the shoulder sling instructions for use if you have any questions regarding the correct fit of your shoulder sling.  [facility] Customer Care for Troubleshooting: [phone]  Video that illustrates how to properly use a shoulder sling: "Instructions for Proper Use of an Orthopaedic Sling" http://bass.com/       PERIPHERAL NERVE BLOCK PATIENT INFORMATION  Your surgeon has requested a peripheral nerve block for your surgery. This anesthetic technique provides excellent post-operative pain relief for you in a safe and effective manner. It will also help reduce the risk of nausea and vomiting and allow earlier discharge from the hospital.   The block is performed under sedation with ultrasound guidance prior to your procedure. Due to the sedation, your may or may not remember the block experience. The nerve block will begin to take effect anywhere from 5 to 30 minutes after being administered. You will be transported to the operating room from your surgery after the block is completed.   At the end of surgery, when the anesthesia wears off, you will notice a few things. Your may not be able to move or feel the part of your body targeted by the nerve block. These are normal experiences, and they will disappear as the block wears off.  If you had an interscalene nerve block  performed (which is common for shoulder surgery), your voice can be very hoarse and you may feel that you are not able to take as deep a breath as you did before surgery. Some patients may also notice a droopy eyelid on the affected side. These symptoms will resolve once the block wears off.  Pain control: The nerve block technique used is a single injection that can last anywhere from 1-3 days. The duration of the numbness can vary between individuals. After leaving the hospital, it is important that you begin to take your prescribed pain medication when you start to sense the nerve block wearing off. This will help you avoid unpleasant pain at the time the nerve block wears off, which can sometimes be in the middle of the night. The block will only cover pain in the areas targeted by the nerve block so if you experience surgical pain outside of that area, please take your prescribed pain medication. Management of the "numb area": After a nerve block, you cannot feel pain, pressure, or temperature in the affected area so there is an increased risk for injury. You should take extra care to protect the affected areas until sensation and movement returns. Please take caution to not come  in contact with extremely hot or cold items because you will not be able to sense or protect yourself form the extremes of temperature.  You may experience some persistent numbness after the procedure by most neurological deficits resolve over time and the incidence of serious long term neurological complications attributable to peripheral nerve blocks are relatively uncommon.  POLAR CARE INFORMATION  MassAdvertisement.it  How to use [employer] Polar Care [facility] Therapy System?  YouTube   ShippingScam.co.uk  OPERATING INSTRUCTIONS  Start the product With dry hands, connect the transformer to the electrical connection located on the top of the cooler. Next, plug the transformer into an appropriate  electrical outlet. The unit will automatically start running at this point.  To stop the pump, disconnect electrical power.  Unplug to stop the product when not in use. Unplugging the Polar Care unit turns it off. Always unplug immediately after use. Never leave it plugged in while unattended. Remove pad.    FIRST ADD WATER TO FILL LINE, THEN ICE---Replace ice when existing ice is almost melted  1 Discuss Treatment with your Licensed Health Care Practitioner and Use Only as Prescribed 2 Apply Insulation Barrier & Cold Therapy Pad 3 Check for Moisture 4 Inspect Skin Regularly  Tips and Trouble Shooting Usage Tips 1. Use cubed or chunked ice for optimal performance. 2. It is recommended to drain the Pad between uses. To drain the pad, hold the Pad upright with the hose pointed toward the ground. Depress the black plunger and allow water to drain out. 3. You may disconnect the Pad from the unit without removing the pad from the affected area by depressing the silver tabs on the hose coupling and gently pulling the hoses apart. The Pad and unit will seal itself and will not leak. Note: Some dripping during release is normal. 4. DO NOT RUN PUMP WITHOUT WATER! The pump in this unit is designed to run with water. Running the unit without water will cause permanent damage to the pump. 5. Unplug unit before removing lid.  TROUBLESHOOTING GUIDE Pump not running, Water not flowing to the pad, Pad is not getting cold 1. Make sure the transformer is plugged into the wall outlet. 2. Confirm that the ice and water are filled to the indicated levels. 3. Make sure there are no kinks in the pad. 4. Gently pull on the blue tube to make sure the tube/pad junction is straight. 5. Remove the pad from the treatment site and ll it while the pad is lying at; then reapply. 6. Confirm that the pad couplings are securely attached to the unit. Listen for the double clicks (Figure 1) to confirm the pad couplings are  securely attached.  Leaks    Note: Some condensation on the lines, controller, and pads is unavoidable, especially in warmer climates. 1. If using a [employer] Polar Care Cold Therapy unit with a detachable Cold Therapy Pad, and a leak exists (other than condensation on the lines) disconnect the pad couplings. Make sure the silver tabs on the couplings are depressed before reconnecting the pad to the pump hose; then confirm both sides of the coupling are properly clicked in. 2. If the coupling continues to leak or a leak is detected in the pad itself, stop using it and call [employer] Customer Care at [phone].  Cleaning After use, empty and dry the unit with a soft cloth. Warm water and mild detergent may be used occasionally to clean the pump and tubes.  WARNING: The Polar  Care Cube can be cold enough to cause serious injury, including full skin necrosis. Follow these Operating Instructions, and carefully read the Product Insert (see pouch on side of unit) and the Cold Therapy Pad Fitting Instructions (provided with each Cold Therapy Pad) prior to use.

## 2024-01-04 NOTE — Progress Notes (Signed)
 Assisted [place] ANMD with right, supraclavicular, ultrasound guided block. Side rails up, monitors on throughout procedure. See vital signs in flow sheet. Tolerated Procedure well.

## 2024-01-04 NOTE — Op Note (Signed)
 SURGERY DATE: 01/04/2024   PRE-OP DIAGNOSIS:  1. Right subacromial impingement 2. Right biceps tendinopathy 3. Right rotator cuff tear 4. Right acromioclavicular joint arthritis  POST-OP DIAGNOSIS: 1. Right subacromial impingement 2. Right biceps tendinopathy 3. Right rotator cuff tear 4. Right acromioclavicular joint arthritis  PROCEDURES:  1. Right arthroscopic rotator cuff repair (full-thickness supraspinatus; small, partial-thickness upper border subscapularis) 2. Right arthroscopic biceps tenodesis 3. Right arthroscopic subacromial decompression 4. Right arthroscopic extensive debridement of shoulder (glenohumeral and subacromial spaces) 5. Right arthroscopic distal clavicle excision  SURGEON: Cleotilde Dago, MD   ASSISTANT: none   ANESTHESIA: Gen with Exparel interscalene block   ESTIMATED BLOOD LOSS: 5cc   DRAINS:  none   TOTAL IV FLUIDS: per anesthesia      SPECIMENS: none   IMPLANTS:  - Arthrex 2.41mm PushLock x 1 - Arthrex 4.71mm SwiveLock x 2 - Arthrex 3.9 mm knotless corkscrew x 1 - Iconix SPEED double loaded with 1.2 and 2.38mm tape x 1 - Stryker Alphavent 4.46mm anchor x 1     OPERATIVE FINDINGS:  Examination under anesthesia: A careful examination under anesthesia was performed.  Passive range of motion was: FF: 150; ER at side: 45; ER in abduction: 90; IR in abduction: 45.  Anterior load shift: NT.  Posterior load shift: NT.  Sulcus in neutral: NT.  Sulcus in ER: NT.     Intra-operative findings: A thorough arthroscopic examination of the shoulder was performed.  The findings are: 1. Biceps tendon: tendinopathy with significant erythema and fraying 2. Superior labrum: erythema 3. Posterior labrum and capsule: normal 4. Inferior capsule and inferior recess: normal 5. Glenoid cartilage surface: Normal 6. Supraspinatus attachment: full-thickness tear of the supraspinatus 7. Posterior rotator cuff attachment: normal 8. Humeral head articular cartilage:  normal 9. Rotator interval: significant synovitis 10: Subscapularis tendon: Small partial-thickness upper border tear 11. Anterior labrum: Mildly degenerative 12. IGHL: normal   OPERATIVE REPORT:    Indications for procedure:  Saranda Legrande is a 68 y.o. female with approximately 3 months of right shoulder pain after she tripped and fell.  Prior to this she did not have any significant pain or dysfunction of the shoulder.  She has had difficulty with overhead motion since that time with sensations of weakness since her injury. Clinical exam and MRI were suggestive of rotator cuff tear, biceps tendinopathy, acromioclavicular joint arthritis and subacromial impingement. After discussion of risks, benefits, and alternatives to surgery, the patient elected to proceed.    Procedure in detail:   I identified Kimori Tartaglia in the pre-operative holding area.  I marked the operative shoulder with my initials. I reviewed the risks and benefits of the proposed surgical intervention, and the patient wished to proceed.  Anesthesia was then performed with an Exparel interscalene block.  The patient was transferred to the operative suite and placed in the beach chair position.     Appropriate IV antibiotics were administered prior to incision. The operative upper extremity was then prepped and draped in standard fashion. A time out was performed confirming the correct extremity, correct patient, and correct procedure.    I then created a standard posterior portal with an 11 blade. The glenohumeral joint was easily entered with a blunt trocar and the arthroscope introduced. The findings of diagnostic arthroscopy are described above. I debrided degenerative tissue including the synovitic tissue about the rotator interval and anterior and superior labrum. I then coagulated the inflamed synovium to obtain hemostasis and reduce the risk of post-operative swelling using an  Arthrocare radiofrequency device.   I then turned  my attention to the arthroscopic biceps tenodesis. The Loop n Tack technique was used to pass a FiberTape through the biceps in a locked fashion adjacent to the biceps anchor.  A hole for a 2.9 mm Arthrex PushLock was drilled in the bicipital groove just superior to the subscapularis tendon insertion.  The biceps tendon was then cut and the biceps anchor complex was debrided down to a stable base on the superior labrum.  The FiberTape was loaded onto the PushLock anchor and impacted into place into the previously drilled hole in the bicipital groove.  This appropriately secured the biceps into the bicipital groove and took it off of tension.   Next, arthroscopic repair of the subscapularis was performed. The lesser tuberosity footprint was prepared with a combination of electrocautery and an arthroscopic curette.  An Arthrex knotless corkscrew was placed into the lesser tuberosity footprint from the anterior portal.  A BirdBeak was used to shuttle the repair suture through the upper border of the subscapularis tendon.  The suture was then shuttled through the anchor. With the arm in neutral rotation, the repair was tensioned appropriately. This appropriately reduced the subscapularis tear.  The arm was then internally and externally rotated and the subscapularis was noted to move appropriately with rotation.  The remainder of the suture was then cut.  Next, the arthroscope was then introduced into the subacromial space. A direct lateral portal was created with an 11-blade after spinal needle localization. An extensive subacromial bursectomy and debridement was performed using a combination of the shaver and Arthrocare wand. The entire acromial undersurface was exposed and the CA ligament was subperiosteally elevated to expose the anterior acromial hook. A burr was used to create a flat anterior and lateral aspect of the acromion, converting it from a Type 2 to a Type 1 acromion. Care was made to keep the deltoid  fascia intact.   I then turned my attention to the arthroscopic distal clavicle excision. I identified the acromioclavicular joint. Surrounding bursal tissue was debrided and the edges of the joint were identified. I used the 5.50mm barrel burr to remove the distal clavicle parallel to the edge of the acromion. I was able to fit two widths of the burr into the space between the distal clavicle and acromion, signifying that I had removed ~81mm of distal clavicle. This was confirmed by viewing anteriorly and introducing a probe with measuring marks from the lateral portal. Hemostasis was achieved with an Arthrocare wand.   Next, I created an accessory posterolateral portal to assist with visualization and instrumentation.  I debrided the poor quality edges of the supraspinatus tendon.  This was a U-shaped tear of the entire supraspinatus.  I prepared the footprint using a burr to expose bleeding bone.     I then percutaneously placed one Iconix SPEED medial row anchor along the anterior portion of the tear at the articular margin. Another SPEED anchor was placed along the posterior portion of the tear at the articular margin.  However, this did not achieve appropriate fixation and pulled out of the bone.  Therefore, a Stryker AlphaVent 4.75 mm anchor double loaded with tape was placed in the same hole.  This did achieve appropriate fixation.  I then shuttled all the strands of tape through the rotator cuff just lateral to the musculotendinous junction using a FirstPass suture passer spanning the anterior to posterior extent of the tear. The posterior strands of each suture were passed  through an IT trainer.  This was placed approximately 2 cm distal to the lateral edge of the footprint in line with the posterior aspect of the tear with appropriate tensioning of each suture prior to final fixation.  Similarly, the anterior strands of each suture were passed through another SwiveLock anchor along the  anterior margin of the tear.  There were a small dogear centrally. The knotless mechanism of the posterior SwiveLock anchor was utilized to reduce the dogear.  This construct allowed for excellent reapproximation of the rotator cuff to its native footprint without undue tension.  Appropriate compression was achieved.  The repair was stable to external and internal rotation.   Fluid was evacuated from the shoulder, and the portals were closed with 3-0 Nylon. Xeroform was applied to the portals. A sterile dressing was applied, followed by a Polar Care sleeve and a SlingShot shoulder immobilizer/sling. The patient was awakened from anesthesia without difficulty and was transferred to the PACU in stable condition.   COMPLICATIONS: none   DISPOSITION: plan for discharge home after recovery in PACU     POSTOPERATIVE PLAN: Remain in sling (except hygiene and elbow/wrist/hand RoM exercises as instructed by PT) x 6 weeks and NWB for this time. PT to begin 3-7 days after surgery.  Large rotator cuff repair rehab protocol. ASA 325mg  daily x 2 weeks for DVT ppx.

## 2024-01-04 NOTE — Anesthesia Preprocedure Evaluation (Signed)
 Anesthesia Evaluation  Patient identified by MRN, date of birth, ID band Patient awake    Reviewed: Allergy & Precautions, NPO status , Patient's Chart, lab work & pertinent test results  History of Anesthesia Complications Negative for: history of anesthetic complications  Airway Mallampati: III  TM Distance: <3 FB Neck ROM: full    Dental  (+) Chipped, Poor Dentition, Missing   Pulmonary neg pulmonary ROS, neg shortness of breath   Pulmonary exam normal        Cardiovascular Exercise Tolerance: Good hypertension, (-) angina (-) Past MI and (-) DOE Normal cardiovascular exam+ dysrhythmias (RBBB)      Neuro/Psych negative neurological ROS  negative psych ROS   GI/Hepatic negative GI ROS, Neg liver ROS,neg GERD  ,,  Endo/Other  negative endocrine ROS    Renal/GU      Musculoskeletal   Abdominal   Peds  Hematology negative hematology ROS (+)   Anesthesia Other Findings  Past Surgical History: No date: CESAREAN SECTION WITH BILATERAL TUBAL LIGATION  BMI    Body Mass Index: 34.20 kg/m      Reproductive/Obstetrics negative OB ROS                             Anesthesia Physical Anesthesia Plan  ASA: 2  Anesthesia Plan: General ETT   Post-op Pain Management: Regional block   Induction: Intravenous  PONV Risk Score and Plan: Ondansetron , Dexamethasone, Midazolam and Treatment may vary due to age or medical condition  Airway Management Planned: Oral ETT  Additional Equipment:   Intra-op Plan:   Post-operative Plan: Extubation in OR  Informed Consent: I have reviewed the patients History and Physical, chart, labs and discussed the procedure including the risks, benefits and alternatives for the proposed anesthesia with the patient or authorized representative who has indicated his/her understanding and acceptance.     Dental Advisory Given  Plan Discussed with:  Anesthesiologist, CRNA and Surgeon  Anesthesia Plan Comments: (Patient consented for risks of anesthesia including but not limited to:  - adverse reactions to medications - damage to eyes, teeth, lips or other oral mucosa - nerve damage due to positioning  - sore throat or hoarseness - Damage to heart, brain, nerves, lungs, other parts of body or loss of life  Patient voiced understanding and assent.)       Anesthesia Quick Evaluation

## 2024-01-04 NOTE — Anesthesia Procedure Notes (Signed)
 Procedure Name: Intubation Date/Time: 01/04/2024 11:10 AM  Performed by: Adrien Horner, CRNAPre-anesthesia Checklist: Patient identified, Emergency Drugs available, Suction available, Patient being monitored and Timeout performed Patient Re-evaluated:Patient Re-evaluated prior to induction Oxygen Delivery Method: Circle system utilized Preoxygenation: Pre-oxygenation with 100% oxygen Induction Type: IV induction Ventilation: Mask ventilation without difficulty Laryngoscope Size: Mac and 3 Grade View: Grade I Tube type: Oral Rae Tube size: 7.0 mm Number of attempts: 1 Airway Equipment and Method: LTA kit utilized Placement Confirmation: ETT inserted through vocal cords under direct vision, positive ETCO2 and breath sounds checked- equal and bilateral Tube secured with: Tape Dental Injury: Teeth and Oropharynx as per pre-operative assessment

## 2024-01-04 NOTE — Anesthesia Procedure Notes (Signed)
 Anesthesia Regional Block: Interscalene brachial plexus block   Pre-Anesthetic Checklist: , timeout performed,  Correct Patient, Correct Site, Correct Laterality,  Correct Procedure, Correct Position, site marked,  Risks and benefits discussed,  Surgical consent,  Pre-op evaluation,  At surgeon's request and post-op pain management  Laterality: Upper and Right  Prep: chloraprep       Needles:  Injection technique: Single-shot  Needle Type: Stimiplex     Needle Length: 9cm  Needle Gauge: 22     Additional Needles:   Procedures:,,,, ultrasound used (permanent image in chart),,    Narrative:  Start time: 01/04/2024 10:22 AM End time: 01/04/2024 10:24 AM Injection made incrementally with aspirations every 5 mL.  Performed by: Personally   Additional Notes: Patient consented for risk and benefits of nerve block including but not limited to nerve damage, Horner's syndrome, failed block, bleeding and infection.  Patient voiced understanding.  Functioning IV was confirmed and monitors were applied.  Timeout done prior to procedure and prior to any sedation being given to the patient.  Patient confirmed procedure site prior to any sedation given to the patient.  A 50mm 22ga Stimuplex needle was used. Sterile prep,hand hygiene and sterile gloves were used.  Minimal sedation used for procedure.  No paresthesia endorsed by patient during the procedure.  Negative aspiration and negative test dose prior to incremental administration of local anesthetic. The patient tolerated the procedure well with no immediate complications.

## 2024-01-04 NOTE — Anesthesia Postprocedure Evaluation (Signed)
 Anesthesia Post Note  Patient: Dorothy Ware  Procedure(s) Performed: ARTHROSCOPY, SHOULDER WITH REPAIR, ROTATOR CUFF, OPEN (Right: Shoulder) DECOMPRESSION, SUBACROMIAL SPACE (Right: Shoulder) TENODESIS, BICEPS (Right: Shoulder) ARTHROSCOPY, SHOULDER WITH DEBRIDEMENT (Right: Shoulder)  Patient location during evaluation: PACU Anesthesia Type: General Level of consciousness: awake and alert Pain management: pain level controlled Vital Signs Assessment: post-procedure vital signs reviewed and stable Respiratory status: spontaneous breathing, nonlabored ventilation and respiratory function stable Cardiovascular status: blood pressure returned to baseline and stable Postop Assessment: no apparent nausea or vomiting Anesthetic complications: no   No notable events documented.   Last Vitals:  Vitals:   01/04/24 1330 01/04/24 1345  BP: (!) 144/75 133/83  Pulse: 88   Resp: 18   Temp:  (!) 36.1 C  SpO2: 97% 96%    Last Pain:  Vitals:   01/04/24 1330  TempSrc:   PainSc: 0-No pain                 Portia Brittle [name]

## 2024-01-04 NOTE — Transfer of Care (Signed)
 Immediate Anesthesia Transfer of Care Note  Patient: Dorothy Ware  Procedure(s) Performed: ARTHROSCOPY, SHOULDER WITH REPAIR, ROTATOR CUFF, OPEN (Right: Shoulder) DECOMPRESSION, SUBACROMIAL SPACE (Right: Shoulder) TENODESIS, BICEPS (Right: Shoulder) ARTHROSCOPY, SHOULDER WITH DEBRIDEMENT (Right: Shoulder)  Patient Location: PACU  Anesthesia Type: General ETT  Level of Consciousness: awake, alert  and patient cooperative  Airway and Oxygen Therapy: Patient Spontanous Breathing and Patient connected to supplemental oxygen  Post-op Assessment: Post-op Vital signs reviewed, Patient's Cardiovascular Status Stable, Respiratory Function Stable, Patent Airway and No signs of Nausea or vomiting  Post-op Vital Signs: Reviewed and stable  Complications: No notable events documented.

## 2024-01-05 ENCOUNTER — Encounter: Payer: Self-pay | Admitting: Orthopedic Surgery

## 2024-07-23 ENCOUNTER — Encounter: Payer: Self-pay | Admitting: Orthopedic Surgery
